# Patient Record
Sex: Female | Born: 1985 | Race: White | Hispanic: No | Marital: Single | Smoking: Current every day smoker
Health system: Southern US, Community
[De-identification: ages and names within clinical notes are randomized; demographics above are authoritative.]

---

## 2003-01-04 ENCOUNTER — Other Ambulatory Visit: Admission: RE | Admit: 2003-01-04 | Discharge: 2003-01-04 | Payer: Self-pay | Admitting: Family Medicine

## 2006-06-10 ENCOUNTER — Other Ambulatory Visit: Admission: RE | Admit: 2006-06-10 | Discharge: 2006-06-10 | Payer: Self-pay | Admitting: Family Medicine

## 2007-10-17 ENCOUNTER — Emergency Department (HOSPITAL_COMMUNITY): Admission: EM | Admit: 2007-10-17 | Discharge: 2007-10-18 | Payer: Self-pay | Admitting: Emergency Medicine

## 2007-11-22 ENCOUNTER — Other Ambulatory Visit: Admission: RE | Admit: 2007-11-22 | Discharge: 2007-11-22 | Payer: Self-pay | Admitting: Family Medicine

## 2008-11-21 ENCOUNTER — Other Ambulatory Visit: Admission: RE | Admit: 2008-11-21 | Discharge: 2008-11-21 | Payer: Self-pay | Admitting: Family Medicine

## 2009-12-18 ENCOUNTER — Other Ambulatory Visit: Admission: RE | Admit: 2009-12-18 | Discharge: 2009-12-18 | Payer: Self-pay | Admitting: Family Medicine

## 2010-01-08 IMAGING — CR DG ABDOMEN ACUTE W/ 1V CHEST
3 series · 3 of 3 positions shown · non-contrast
Comparison: None

CLINICAL DATA: Severe abdominal pain

ACUTE ABDOMEN SERIES (ABDOMEN 2 VIEW & CHEST 1 VIEW)

[w chest pa]
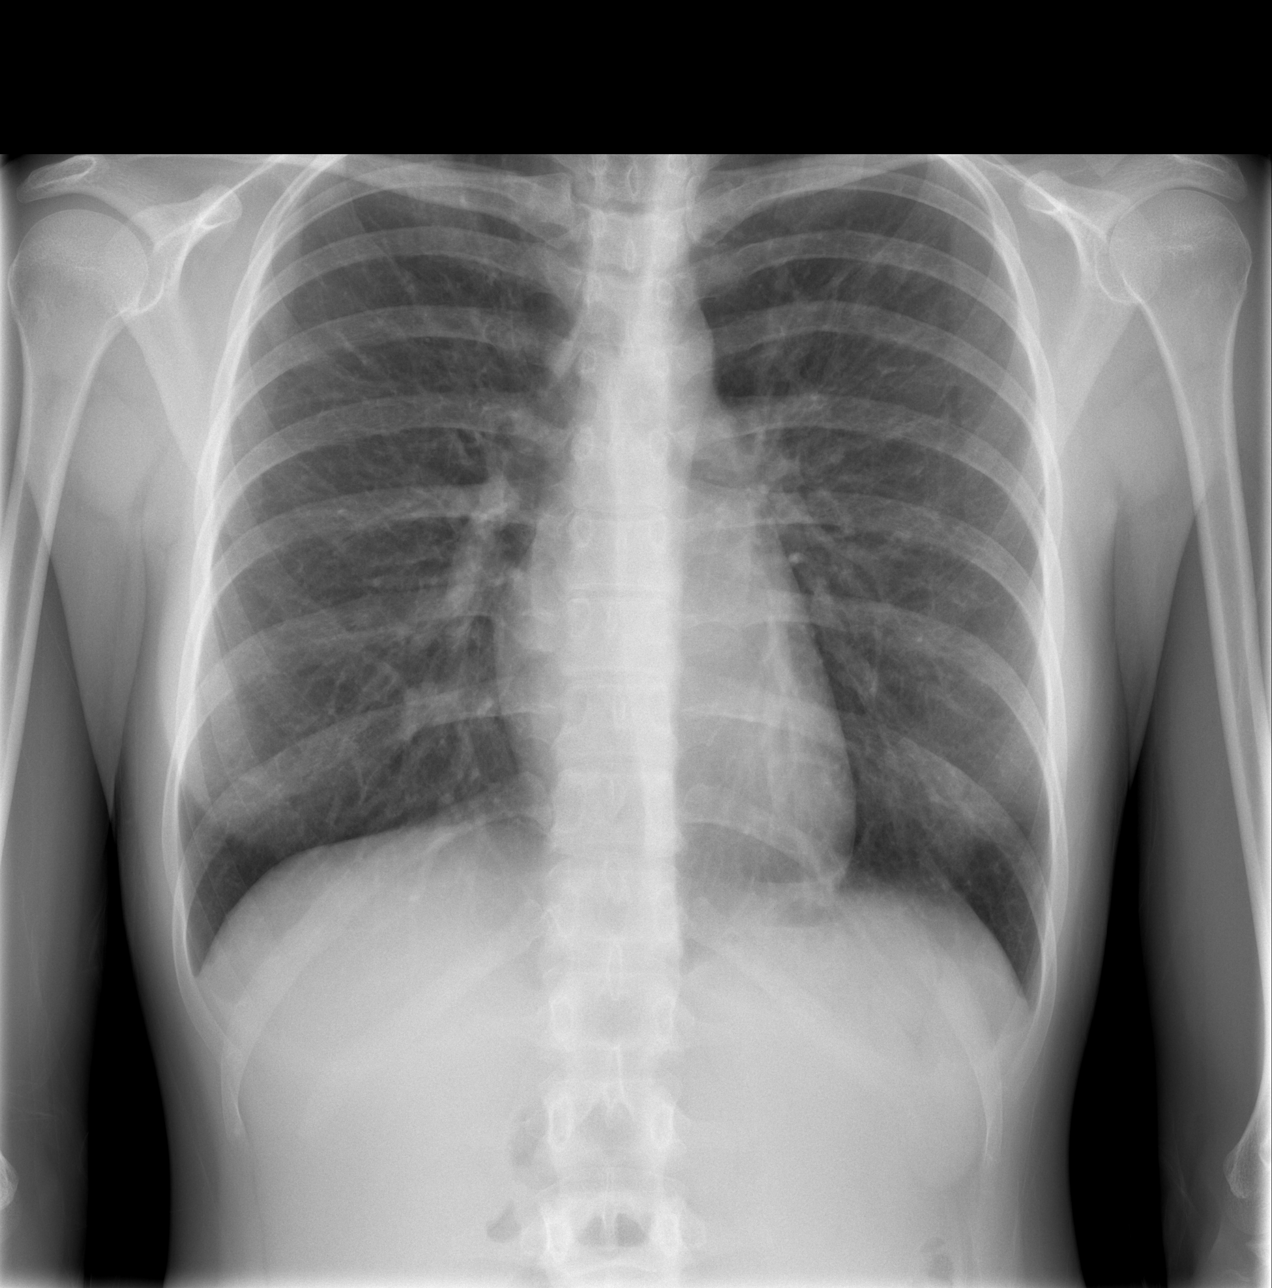

[w abdomen upright]
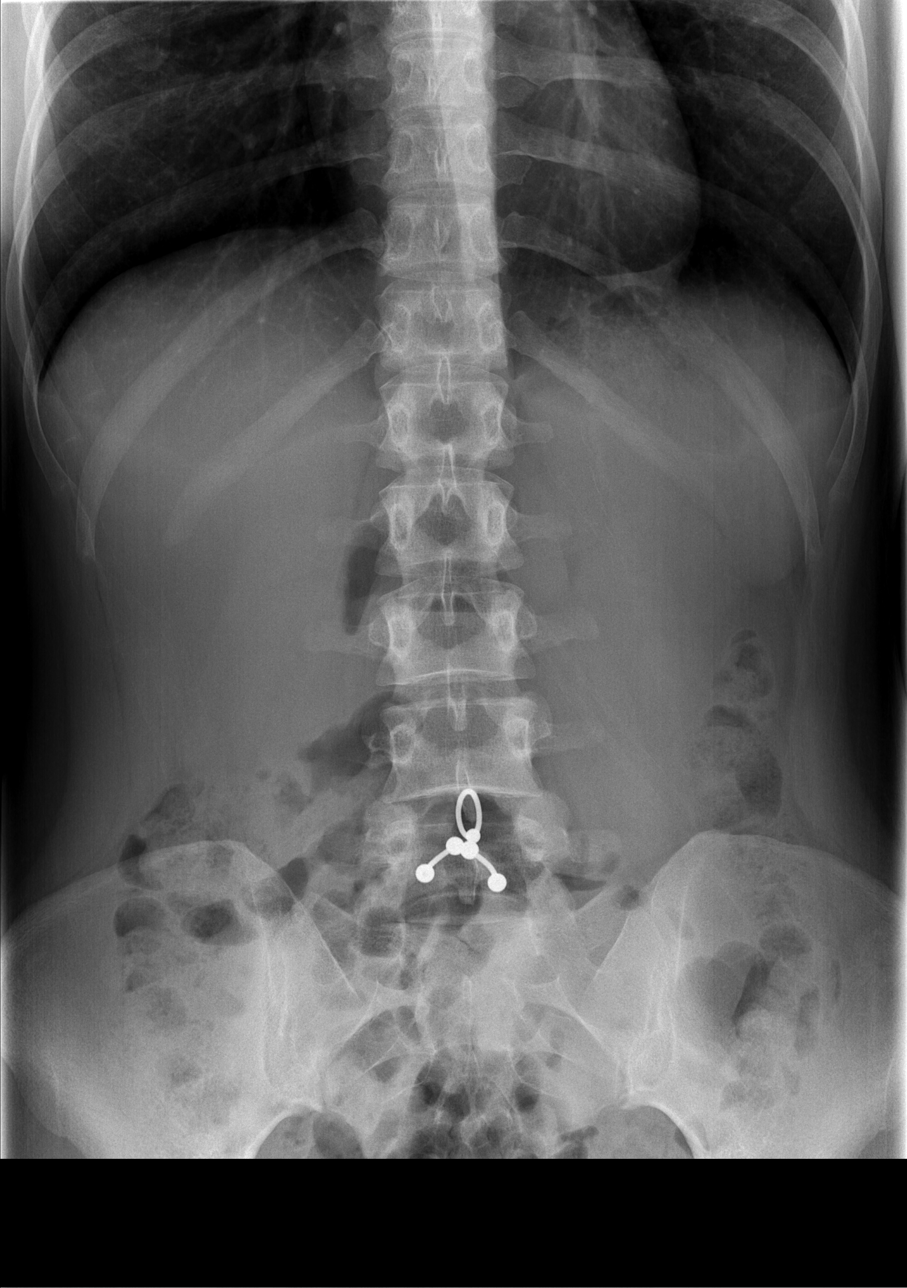

[t abdomen supine]
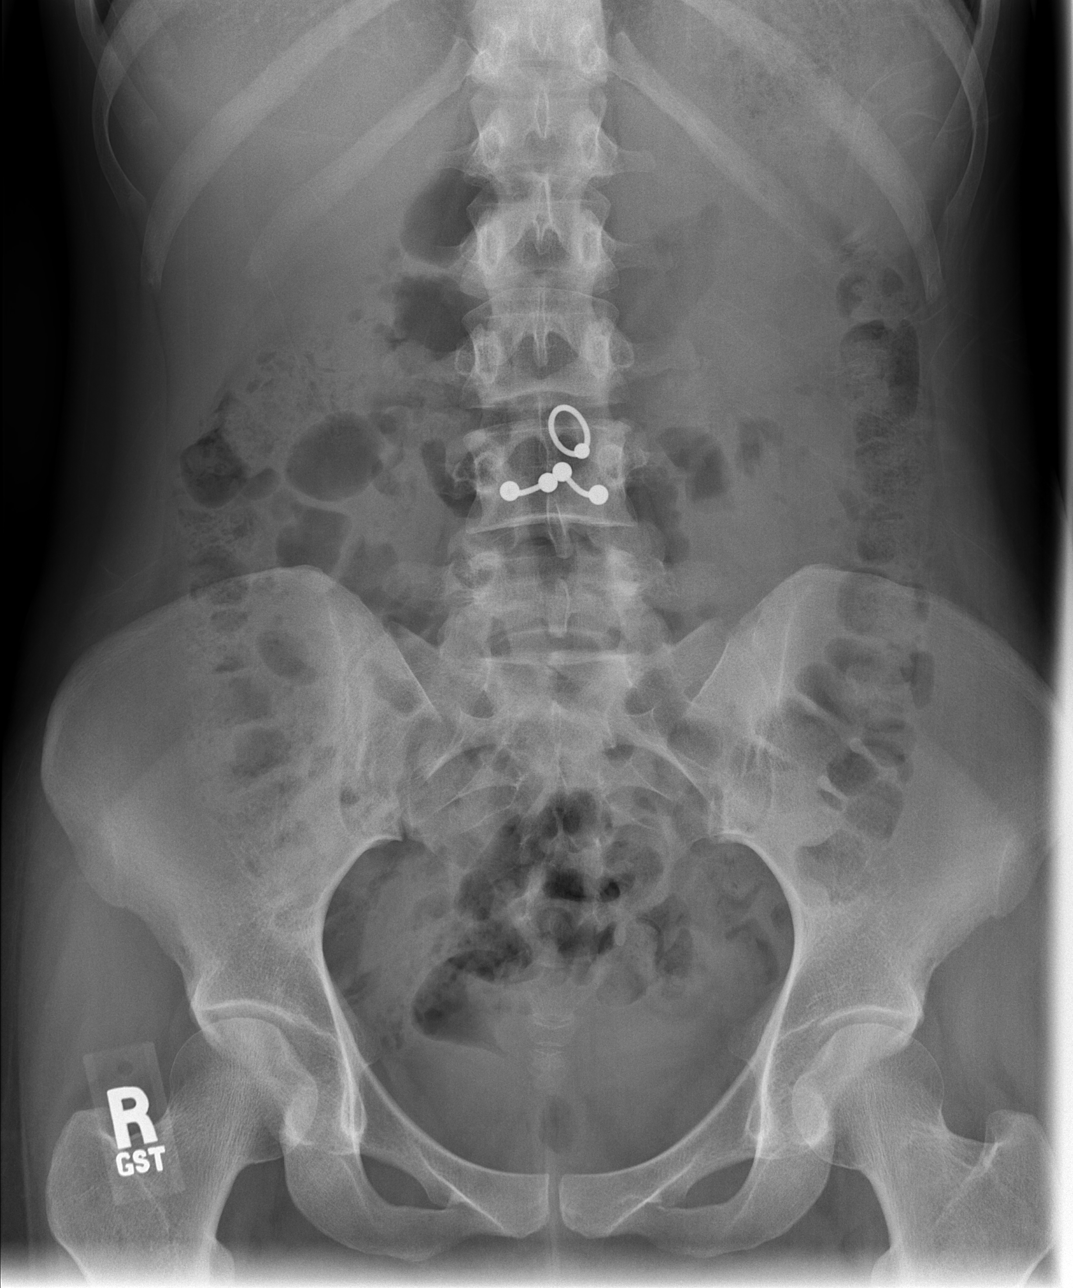

[3 of 3 positions shown; findings below may reference images not displayed]

FINDINGS: The lungs are clear.  The heart is normal.  A normal
bowel gas pattern is identified.  The osseous structures are
normal.
IMPRESSION: 1.  Negative for acute cardiopulmonary process.
2.  Normal bowel gas.

## 2010-11-18 LAB — URINALYSIS, ROUTINE W REFLEX MICROSCOPIC
Bilirubin Urine: NEGATIVE
Glucose, UA: NEGATIVE
Hgb urine dipstick: NEGATIVE
Ketones, ur: NEGATIVE
Protein, ur: NEGATIVE
pH: 8

## 2010-11-18 LAB — DIFFERENTIAL
Eosinophils Absolute: 0.2
Eosinophils Relative: 2
Lymphs Abs: 1.7
Monocytes Absolute: 0.9

## 2010-11-18 LAB — CBC
HCT: 36.1
Hemoglobin: 12.3
MCV: 92.1
Platelets: 163
WBC: 9.5

## 2010-11-18 LAB — COMPREHENSIVE METABOLIC PANEL
Albumin: 3.7
Alkaline Phosphatase: 47
BUN: 7
CO2: 25
Chloride: 103
Creatinine, Ser: 0.7
GFR calc non Af Amer: >60
Glucose, Bld: 89
Potassium: 3.9
Total Bilirubin: 0.4

## 2010-11-18 LAB — WET PREP, GENITAL

## 2010-11-18 LAB — POCT I-STAT, CHEM 8
BUN: 6
Calcium, Ion: 1.21
Chloride: 105
Creatinine, Ser: 0.8
Glucose, Bld: 86
Potassium: 3.7

## 2010-11-18 LAB — GC/CHLAMYDIA PROBE AMP, GENITAL
Chlamydia, DNA Probe: NEGATIVE
GC Probe Amp, Genital: NEGATIVE

## 2010-12-22 ENCOUNTER — Other Ambulatory Visit: Payer: Self-pay | Admitting: Physician Assistant

## 2010-12-22 ENCOUNTER — Other Ambulatory Visit (HOSPITAL_COMMUNITY)
Admission: RE | Admit: 2010-12-22 | Discharge: 2010-12-22 | Disposition: A | Discharge status: Home / Self Care | Payer: 59 | Source: Ambulatory Visit | Attending: Family Medicine | Admitting: Family Medicine

## 2010-12-22 DIAGNOSIS — Z124 Encounter for screening for malignant neoplasm of cervix: Secondary | ICD-10-CM | POA: Insufficient documentation

## 2012-01-18 ENCOUNTER — Ambulatory Visit (INDEPENDENT_AMBULATORY_CARE_PROVIDER_SITE_OTHER): Payer: 59 | Admitting: Physician Assistant

## 2012-01-18 VITALS — BP 122/60 | HR 72 | Temp 98.2°F | Resp 18 | Ht 65.0 in | Wt 143.0 lb

## 2012-01-18 DIAGNOSIS — R350 Frequency of micturition: Secondary | ICD-10-CM

## 2012-01-18 DIAGNOSIS — N390 Urinary tract infection, site not specified: Secondary | ICD-10-CM

## 2012-01-18 DIAGNOSIS — R300 Dysuria: Secondary | ICD-10-CM

## 2012-01-18 LAB — POCT URINALYSIS DIPSTICK
Bilirubin, UA: NEGATIVE
Glucose, UA: 100
Nitrite, UA: POSITIVE
Spec Grav, UA: 1.01
Urobilinogen, UA: 1

## 2012-01-18 LAB — POCT UA - MICROSCOPIC ONLY

## 2012-01-18 MED ORDER — CIPROFLOXACIN HCL 500 MG PO TABS: 500.0000 mg | ORAL_TABLET | Freq: Two times a day (BID) | ORAL | Status: DC

## 2012-01-18 NOTE — Patient Instructions (Addendum)
Urinary Tract Infection Urinary tract infections (UTIs) can develop anywhere along your urinary tract. Your urinary tract is your body's drainage system for removing wastes and extra water. Your urinary tract includes two kidneys, two ureters, a bladder, and a urethra. Your kidneys are a pair of bean-shaped organs. Each kidney is about the size of your fist. They are located below your ribs, one on each side of your spine. CAUSES Infections are caused by microbes, which are microscopic organisms, including fungi, viruses, and bacteria. These organisms are so small that they can only be seen through a microscope. Bacteria are the microbes that most commonly cause UTIs. SYMPTOMS  Symptoms of UTIs may vary by age and gender of the patient and by the location of the infection. Symptoms in young women typically include a frequent and intense urge to urinate and a painful, burning feeling in the bladder or urethra during urination. Older women and men are more likely to be tired, shaky, and weak and have muscle aches and abdominal pain. A fever may mean the infection is in your kidneys. Other symptoms of a kidney infection include pain in your back or sides below the ribs, nausea, and vomiting. DIAGNOSIS To diagnose a UTI, your caregiver will ask you about your symptoms. Your caregiver also will ask to provide a urine sample. The urine sample will be tested for bacteria and white blood cells. White blood cells are made by your body to help fight infection. TREATMENT  Typically, UTIs can be treated with medication. Because most UTIs are caused by a bacterial infection, they usually can be treated with the use of antibiotics. The choice of antibiotic and length of treatment depend on your symptoms and the type of bacteria causing your infection. HOME CARE INSTRUCTIONS  If you were prescribed antibiotics, take them exactly as your caregiver instructs you. Finish the medication even if you feel better after you  have only taken some of the medication.  Drink enough water and fluids to keep your urine clear or pale yellow.  Avoid caffeine, tea, and carbonated beverages. They tend to irritate your bladder.  Empty your bladder often. Avoid holding urine for long periods of time.  Empty your bladder before and after sexual intercourse.  After a bowel movement, women should cleanse from front to back. Use each tissue only once. SEEK MEDICAL CARE IF:   You have back pain.  You develop a fever.  Your symptoms do not begin to resolve within 3 days. SEEK IMMEDIATE MEDICAL CARE IF:   You have severe back pain or lower abdominal pain.  You develop chills.  You have nausea or vomiting.  You have continued burning or discomfort with urination. MAKE SURE YOU:   Understand these instructions.  Will watch your condition.  Will get help right away if you are not doing well or get worse. Document Released: 11/11/2004 Document Revised: 08/03/2011 Document Reviewed: 03/12/2011 [employer] Patient Information 2013 [employer].  

## 2012-01-18 NOTE — Progress Notes (Signed)
Subjective:    Patient ID: Cassandra Clay, female    DOB: [birth year], 25 y.o.   MRN: [id number]  HPI   Cassandra Clay is a 26 yr old female here with UTI symptoms that began last night.  Experiencing frequency and dysuria.  No abd or back pain.  No NV.  No fever or chills.  No vaginal symptoms.  Gets UTIs once every couple years.  Has been hydrating well and using Azo with good symptom relief.     Review of Systems  Constitutional: Negative for fever and chills.  HENT: Negative.   Respiratory: Negative.   Cardiovascular: Negative.   Gastrointestinal: Negative.   Genitourinary: Positive for dysuria and frequency. Negative for hematuria, flank pain and vaginal discharge.  Musculoskeletal: Negative.   Neurological: Negative.        Objective:   Physical Exam  Vitals reviewed. Constitutional: She is oriented to person, place, and time. She appears well-developed and well-nourished.  HENT:  Head: Normocephalic and atraumatic.  Cardiovascular: Normal rate, regular rhythm, normal heart sounds and intact distal pulses.  Exam reveals no gallop and no friction rub.   No murmur heard. Pulmonary/Chest: Effort normal and breath sounds normal. She has no wheezes. She has no rales.  Abdominal: Soft. Bowel sounds are normal. She exhibits no distension and no mass. There is tenderness in the suprapubic area. There is CVA tenderness (right). There is no rigidity, no rebound, no guarding and no tenderness at McBurney's point.  Neurological: She is alert and oriented to person, place, and time.  Skin: Skin is warm and dry.  Psychiatric: She has a normal mood and affect. Her behavior is normal.     Filed Vitals:   01/18/12 1351  BP: 122/60  Pulse: 72  Temp: 98.2 F (36.8 C)  Resp: 18      Results for orders placed in visit on 01/18/12  POCT URINALYSIS DIPSTICK      Component Value Range   Color, UA orange     Clarity, UA clear     Glucose, UA 100     Bilirubin, UA neg     Ketones, UA neg      Spec Grav, UA 1.010     Blood, UA moderate     pH, UA 6.5     Protein, UA 30     Urobilinogen, UA 1.0     Nitrite, UA pos     Leukocytes, UA Negative    POCT UA - MICROSCOPIC ONLY      Component Value Range   WBC, Ur, HPF, POC 1-5     RBC, urine, microscopic 3-8     Bacteria, U Microscopic trace     Mucus, UA trace     Epithelial cells, urine per micros 0-4     Crystals, Ur, HPF, POC neg     Casts, Ur, LPF, POC neg     Yeast, UA neg           Assessment & Plan:   1. Dysuria  POCT urinalysis dipstick, POCT UA - Microscopic Only, Urine culture  2. Urine frequency  POCT urinalysis dipstick, POCT UA - Microscopic Only  3. UTI (lower urinary tract infection)  Urine culture, ciprofloxacin (CIPRO) 500 MG tablet    Cassandra Clay is a very pleasant 26 yr old female with UTI.  Will treat with 5 days of Cipro.  Encouraged pt to continue hydrating well.  She may continue using Azo for symptomatic relief.  She will call or  RTC if worsening or not improving.

## 2012-01-21 LAB — URINE CULTURE: Colony Count: 50000

## 2012-05-09 ENCOUNTER — Other Ambulatory Visit: Payer: Self-pay | Admitting: Obstetrics and Gynecology

## 2012-05-09 ENCOUNTER — Other Ambulatory Visit (HOSPITAL_COMMUNITY)
Admission: RE | Admit: 2012-05-09 | Discharge: 2012-05-09 | Disposition: A | Discharge status: Home / Self Care | Payer: 59 | Source: Ambulatory Visit | Attending: Obstetrics and Gynecology | Admitting: Obstetrics and Gynecology

## 2012-05-09 DIAGNOSIS — Z01419 Encounter for gynecological examination (general) (routine) without abnormal findings: Secondary | ICD-10-CM | POA: Insufficient documentation

## 2012-05-09 DIAGNOSIS — Z113 Encounter for screening for infections with a predominantly sexual mode of transmission: Secondary | ICD-10-CM | POA: Insufficient documentation

## 2012-08-21 DIAGNOSIS — M222X9 Patellofemoral disorders, unspecified knee: Secondary | ICD-10-CM | POA: Insufficient documentation

## 2012-11-08 ENCOUNTER — Telehealth: Payer: Self-pay | Admitting: *Deleted

## 2012-11-08 NOTE — Telephone Encounter (Signed)
Incorrect patient chart opened.Cassandra Clay [place]

## 2014-05-16 ENCOUNTER — Other Ambulatory Visit (HOSPITAL_COMMUNITY)
Admission: RE | Admit: 2014-05-16 | Discharge: 2014-05-16 | Disposition: A | Discharge status: Home / Self Care | Payer: 59 | Source: Ambulatory Visit | Attending: Obstetrics and Gynecology | Admitting: Obstetrics and Gynecology

## 2014-05-16 ENCOUNTER — Other Ambulatory Visit: Payer: Self-pay | Admitting: Obstetrics and Gynecology

## 2014-05-16 DIAGNOSIS — Z01419 Encounter for gynecological examination (general) (routine) without abnormal findings: Secondary | ICD-10-CM | POA: Diagnosis not present

## 2014-05-16 DIAGNOSIS — Z1151 Encounter for screening for human papillomavirus (HPV): Secondary | ICD-10-CM | POA: Diagnosis present

## 2014-05-21 LAB — CYTOLOGY - PAP

## 2014-12-17 DIAGNOSIS — M84374A Stress fracture, right foot, initial encounter for fracture: Secondary | ICD-10-CM | POA: Insufficient documentation

## 2014-12-17 DIAGNOSIS — M79671 Pain in right foot: Secondary | ICD-10-CM | POA: Insufficient documentation

## 2016-06-07 ENCOUNTER — Other Ambulatory Visit: Payer: Self-pay | Admitting: Obstetrics and Gynecology

## 2016-06-07 ENCOUNTER — Other Ambulatory Visit (HOSPITAL_COMMUNITY)
Admission: RE | Admit: 2016-06-07 | Discharge: 2016-06-07 | Disposition: A | Discharge status: Home / Self Care | Payer: Commercial Managed Care - HMO | Source: Ambulatory Visit | Attending: Obstetrics and Gynecology | Admitting: Obstetrics and Gynecology

## 2016-06-07 DIAGNOSIS — Z01419 Encounter for gynecological examination (general) (routine) without abnormal findings: Secondary | ICD-10-CM | POA: Diagnosis not present

## 2016-06-09 LAB — CYTOLOGY - PAP: DIAGNOSIS: NEGATIVE

## 2016-12-09 DIAGNOSIS — Z719 Counseling, unspecified: Secondary | ICD-10-CM | POA: Diagnosis not present

## 2017-05-11 DIAGNOSIS — J329 Chronic sinusitis, unspecified: Secondary | ICD-10-CM | POA: Diagnosis not present

## 2017-07-18 DIAGNOSIS — Z13220 Encounter for screening for lipoid disorders: Secondary | ICD-10-CM | POA: Diagnosis not present

## 2017-07-18 DIAGNOSIS — Z01419 Encounter for gynecological examination (general) (routine) without abnormal findings: Secondary | ICD-10-CM | POA: Diagnosis not present

## 2017-07-25 DIAGNOSIS — Z30431 Encounter for routine checking of intrauterine contraceptive device: Secondary | ICD-10-CM | POA: Diagnosis not present

## 2021-02-24 NOTE — H&P (Signed)
Cassandra Clay is an 36 y.o. G0 who is admitted for Laparoscopic bilateral salpingectomy due to desire for permanent sterilization.  Although patient has Mirena IUD currently (second one placed in 2019), she desires definitive/permanent sterilization, especially in this political climate. She would like to keep her IUD in place as it causes amenorrhea. She is 100% positive that she never has nor will ever desire children in the future.  Patient Active Problem List   Diagnosis Date Noted   Right foot pain 12/17/2014   Stress fracture of right foot 12/17/2014   Patellofemoral pain syndrome 08/21/2012   MEDICAL/FAMILY/SOCIAL HX: No LMP recorded.    No past medical history on file.  No past surgical history on file.  Family History  Problem Relation Age of Onset   Hypertension Mother    Hypertension Maternal Grandfather     Social History:  reports that she has been smoking. She does not have any smokeless tobacco history on file. She reports current alcohol use. She reports that she does not use drugs.  ALLERGIES/MEDS:  Allergies:  Allergies  Allergen Reactions   Sulfa Antibiotics     Unsure -child hood    No medications prior to admission.     Review of Systems  Constitutional: Negative.   HENT: Negative.    Eyes: Negative.   Respiratory: Negative.    Cardiovascular: Negative.   Gastrointestinal: Negative.   Genitourinary: Negative.   Musculoskeletal: Negative.   Skin: Negative.   Neurological: Negative.   Endo/Heme/Allergies: Negative.   Psychiatric/Behavioral: Negative.     There were no vitals taken for this visit. Gen:  NAD, pleasant and cooperative Cardio:  RRR Pulm:  CTAB, no wheezes/rales/rhonchi Abd:  Soft, non-distended, non-tender throughout, no rebound/guarding Ext:  No bilateral LE edema, no bilateral calf tenderness  No results found for this or any previous visit (from the past 24 hour(s)).  No results found.   ASSESSMENT/PLAN: Cassandra Clay is a 36 y.o. G0 who is admitted for Laparoscopic bilateral salpingectomy due to desire for permanent sterilization.  - Admit to [facility] - Admit labs (CBC, T&S, COVID screen) - Diet:  NPO - IVF:  Per anesthesia - VTE Prophylaxis:  SCDs - No antibiotics indicated - D/C home same-day  Consents: I have discussed with the patient that this surgery is performed to provide permanent female sterilization by removing the entirety of each fallopian tube.  Prior to surgery, the risks and benefits of the surgery, as well as alternative treatments, have been discussed.  The risks of proceeding with this surgery include, but are not limited to, bleeding, including the need for a blood transfusion, infection, damage to surrounding organs and tissues, damage to bladder, damage to ureters, causing kidney damage, and requiring additional procedures, damage to bowels, resulting in further surgery, postoperative pain, short-term and long-term, scarring on the abdominal wall and intra-abdominally, need for further surgery, development of an incisional hernia, need for an open procedure, deep vein thrombosis and/or pulmonary embolism, wound infection and/or separation, painful intercourse, failure of the procedure to prevent pregnancy, increased risk of ectopic pregnancy requiring surgery if procedure failure occurs complications the course of which cannot be predicted or prevented, and death. Patient was counseled on the risks, benefits and alternatives of bilateral tubal sterilization.  Like any surgery this procedure carries risks including to but not limited to infection, damage to surrounding structures requiring additional surgery, thromboembolism, and even death.  She understands that there is a failure rate of approximently 1-2%, and if  she were to get pregnant after a bilateral tubal sterilization, there is a 50% chance of ectopic pregnancy.  She was advised that if she should miss her period and/or have a  positive pregnancy test after her bilateral tubal sterilization, she should seek medical assistance.  She is aware that this is a permanent procedure which is not indented to be reversed. Patient was consented for blood products.  The patient is aware that bleeding may result in the need for a blood transfusion which includes risk of transmission of HIV (1:2 million), Hepatitis C (1:2 million), and Hepatitis B (1:200 thousand) and transfusion reaction.  Patient voiced understanding of the above risks as well as understanding of indications for blood transfusion.    Steva Ready, DO [phone] (office)

## 2021-02-27 ENCOUNTER — Other Ambulatory Visit: Payer: Self-pay

## 2021-02-27 ENCOUNTER — Encounter (HOSPITAL_BASED_OUTPATIENT_CLINIC_OR_DEPARTMENT_OTHER): Payer: Self-pay | Admitting: Obstetrics and Gynecology

## 2021-03-09 ENCOUNTER — Encounter (HOSPITAL_BASED_OUTPATIENT_CLINIC_OR_DEPARTMENT_OTHER)
Admission: RE | Admit: 2021-03-09 | Discharge: 2021-03-09 | Disposition: A | Discharge status: Home / Self Care | Payer: 59 | Source: Ambulatory Visit | Attending: Obstetrics and Gynecology | Admitting: Obstetrics and Gynecology

## 2021-03-09 DIAGNOSIS — F17200 Nicotine dependence, unspecified, uncomplicated: Secondary | ICD-10-CM | POA: Diagnosis not present

## 2021-03-09 DIAGNOSIS — Z975 Presence of (intrauterine) contraceptive device: Secondary | ICD-10-CM | POA: Diagnosis not present

## 2021-03-09 DIAGNOSIS — Z302 Encounter for sterilization: Secondary | ICD-10-CM | POA: Diagnosis present

## 2021-03-09 LAB — CBC
HCT: 40.6 % (ref 36.0–46.0)
Hemoglobin: 13.6 g/dL (ref 12.0–15.0)
MCH: 30.6 pg (ref 26.0–34.0)
MCHC: 33.5 g/dL (ref 30.0–36.0)
MCV: 91.4 fL (ref 80.0–100.0)
Platelets: 249 10*3/uL (ref 150–400)
RBC: 4.44 MIL/uL (ref 3.87–5.11)
RDW: 12.2 % (ref 11.5–15.5)
WBC: 9.5 10*3/uL (ref 4.0–10.5)
nRBC: 0 % (ref 0.0–0.2)

## 2021-03-09 LAB — TYPE AND SCREEN
ABO/RH(D): A POS
Antibody Screen: NEGATIVE

## 2021-03-11 ENCOUNTER — Other Ambulatory Visit: Payer: Self-pay

## 2021-03-11 ENCOUNTER — Ambulatory Visit (HOSPITAL_BASED_OUTPATIENT_CLINIC_OR_DEPARTMENT_OTHER): Payer: 59 | Admitting: Anesthesiology

## 2021-03-11 ENCOUNTER — Encounter (HOSPITAL_BASED_OUTPATIENT_CLINIC_OR_DEPARTMENT_OTHER): Payer: Self-pay | Admitting: Obstetrics and Gynecology

## 2021-03-11 ENCOUNTER — Ambulatory Visit (HOSPITAL_BASED_OUTPATIENT_CLINIC_OR_DEPARTMENT_OTHER)
Admission: RE | Admit: 2021-03-11 | Discharge: 2021-03-11 | Disposition: A | Discharge status: Home / Self Care | Payer: 59 | Source: Ambulatory Visit | Attending: Obstetrics and Gynecology | Admitting: Obstetrics and Gynecology

## 2021-03-11 ENCOUNTER — Encounter (HOSPITAL_BASED_OUTPATIENT_CLINIC_OR_DEPARTMENT_OTHER)
Admission: RE | Disposition: A | Discharge status: Home / Self Care | Payer: Self-pay | Source: Ambulatory Visit | Attending: Obstetrics and Gynecology

## 2021-03-11 DIAGNOSIS — F17200 Nicotine dependence, unspecified, uncomplicated: Secondary | ICD-10-CM | POA: Insufficient documentation

## 2021-03-11 DIAGNOSIS — Z302 Encounter for sterilization: Secondary | ICD-10-CM | POA: Diagnosis not present

## 2021-03-11 DIAGNOSIS — Z975 Presence of (intrauterine) contraceptive device: Secondary | ICD-10-CM | POA: Insufficient documentation

## 2021-03-11 HISTORY — PX: LAPAROSCOPIC BILATERAL SALPINGECTOMY: SHX5889

## 2021-03-11 LAB — POCT PREGNANCY, URINE: Preg Test, Ur: NEGATIVE

## 2021-03-11 SURGERY — SALPINGECTOMY, BILATERAL, LAPAROSCOPIC
Anesthesia: General | Laterality: Bilateral

## 2021-03-11 MED ORDER — FENTANYL CITRATE (PF) 100 MCG/2ML IJ SOLN
INTRAMUSCULAR | Status: DC | PRN
  Administered 2021-03-11 (×2): 50 ug via INTRAVENOUS
  Administered 2021-03-11: 100 ug via INTRAVENOUS

## 2021-03-11 MED ORDER — AMISULPRIDE (ANTIEMETIC) 5 MG/2ML IV SOLN: 10.0000 mg | Freq: Once | INTRAVENOUS | Status: DC | PRN

## 2021-03-11 MED ORDER — PROPOFOL 10 MG/ML IV BOLUS
INTRAVENOUS | Status: DC | PRN
  Administered 2021-03-11: 50 mg via INTRAVENOUS
  Administered 2021-03-11: 150 mg via INTRAVENOUS

## 2021-03-11 MED ORDER — SUGAMMADEX SODIUM 200 MG/2ML IV SOLN
INTRAVENOUS | Status: DC | PRN
  Administered 2021-03-11: 200 mg via INTRAVENOUS

## 2021-03-11 MED ORDER — HYDROMORPHONE HCL 1 MG/ML IJ SOLN: 0.2500 mg | INTRAMUSCULAR | Status: DC | PRN

## 2021-03-11 MED ORDER — MEPERIDINE HCL 25 MG/ML IJ SOLN: 6.2500 mg | INTRAMUSCULAR | Status: DC | PRN

## 2021-03-11 MED ORDER — FENTANYL CITRATE (PF) 100 MCG/2ML IJ SOLN
INTRAMUSCULAR | Status: AC
  Filled 2021-03-11: qty 2

## 2021-03-11 MED ORDER — EPHEDRINE SULFATE (PRESSORS) 50 MG/ML IJ SOLN: INTRAMUSCULAR | Status: DC | PRN

## 2021-03-11 MED ORDER — LIDOCAINE HCL (CARDIAC) PF 100 MG/5ML IV SOSY
PREFILLED_SYRINGE | INTRAVENOUS | Status: DC | PRN
  Administered 2021-03-11: 30 mg via INTRAVENOUS

## 2021-03-11 MED ORDER — IBUPROFEN 800 MG PO TABS: 800.0000 mg | ORAL_TABLET | Freq: Three times a day (TID) | ORAL | 0 refills | Status: AC | PRN

## 2021-03-11 MED ORDER — DEXAMETHASONE SODIUM PHOSPHATE 4 MG/ML IJ SOLN
INTRAMUSCULAR | Status: DC | PRN
  Administered 2021-03-11: 5 mg via INTRAVENOUS

## 2021-03-11 MED ORDER — MIDAZOLAM HCL 2 MG/2ML IJ SOLN
INTRAMUSCULAR | Status: AC
  Filled 2021-03-11: qty 2

## 2021-03-11 MED ORDER — OXYCODONE HCL 5 MG/5ML PO SOLN: 5.0000 mg | Freq: Once | ORAL | Status: DC | PRN

## 2021-03-11 MED ORDER — OXYCODONE HCL 5 MG PO TABS: 5.0000 mg | ORAL_TABLET | Freq: Once | ORAL | Status: DC | PRN

## 2021-03-11 MED ORDER — LACTATED RINGERS IV SOLN: INTRAVENOUS | Status: DC

## 2021-03-11 MED ORDER — OXYCODONE HCL 5 MG PO TABS: 5.0000 mg | ORAL_TABLET | ORAL | 0 refills | Status: AC | PRN

## 2021-03-11 MED ORDER — PROMETHAZINE HCL 25 MG/ML IJ SOLN: 6.2500 mg | INTRAMUSCULAR | Status: DC | PRN

## 2021-03-11 MED ORDER — MIDAZOLAM HCL 5 MG/5ML IJ SOLN
INTRAMUSCULAR | Status: DC | PRN
  Administered 2021-03-11: 2 mg via INTRAVENOUS

## 2021-03-11 MED ORDER — ROCURONIUM BROMIDE 100 MG/10ML IV SOLN
INTRAVENOUS | Status: DC | PRN
  Administered 2021-03-11: 80 mg via INTRAVENOUS

## 2021-03-11 MED ORDER — ONDANSETRON HCL 4 MG/2ML IJ SOLN
INTRAMUSCULAR | Status: DC | PRN
Start: 2021-03-11 — End: 2021-03-11
  Administered 2021-03-11: 4 mg via INTRAVENOUS

## 2021-03-11 MED ORDER — BUPIVACAINE HCL (PF) 0.25 % IJ SOLN
INTRAMUSCULAR | Status: DC | PRN
  Administered 2021-03-11: 30 mL

## 2021-03-11 SURGICAL SUPPLY — 23 items
ADH SKN CLS APL DERMABOND .7 (GAUZE/BANDAGES/DRESSINGS) ×1
DERMABOND ADVANCED (GAUZE/BANDAGES/DRESSINGS) ×1
DERMABOND ADVANCED .7 DNX12 (GAUZE/BANDAGES/DRESSINGS) ×2 IMPLANT
DURAPREP 26ML APPLICATOR (WOUND CARE) ×3 IMPLANT
GAUZE 4X4 16PLY ~~LOC~~+RFID DBL (SPONGE) ×3 IMPLANT
GLOVE SURG ENC MOIS LTX SZ6.5 (GLOVE) ×6 IMPLANT
GLOVE SURG UNDER POLY LF SZ6.5 (GLOVE) ×3 IMPLANT
GLOVE SURG UNDER POLY LF SZ7 (GLOVE) ×6 IMPLANT
GOWN STRL REUS W/ TWL LRG LVL3 (GOWN DISPOSABLE) ×4 IMPLANT
GOWN STRL REUS W/TWL LRG LVL3 (GOWN DISPOSABLE) ×4
LIGASURE VESSEL 5MM BLUNT TIP (ELECTROSURGICAL) ×3 IMPLANT
NS IRRIG 1000ML POUR BTL (IV SOLUTION) ×3 IMPLANT
PACK LAPAROSCOPY BASIN (CUSTOM PROCEDURE TRAY) ×3 IMPLANT
PACK TRENDGUARD 450 HYBRID PRO (MISCELLANEOUS) IMPLANT
PAD OB MATERNITY 4.3X12.25 (PERSONAL CARE ITEMS) ×3 IMPLANT
SET TUBE SMOKE EVAC HIGH FLOW (TUBING) ×3 IMPLANT
SLEEVE ENDOPATH XCEL 5M (ENDOMECHANICALS) ×6 IMPLANT
SUT VICRYL 4-0 PS2 18IN ABS (SUTURE) ×4 IMPLANT
TOWEL GREEN STERILE FF (TOWEL DISPOSABLE) ×6 IMPLANT
TRAY FOLEY W/BAG SLVR 14FR LF (SET/KITS/TRAYS/PACK) ×3 IMPLANT
TRENDGUARD 450 HYBRID PRO PACK (MISCELLANEOUS) ×2
TROCAR XCEL NON-BLD 5MMX100MML (ENDOMECHANICALS) ×3 IMPLANT
WARMER LAPAROSCOPE (MISCELLANEOUS) ×3 IMPLANT

## 2021-03-11 NOTE — Interval H&P Note (Signed)
History and Physical Interval Note:  03/11/2021 12:39 PM  Cassandra Clay  has presented today for surgery, with the diagnosis of Sterilization.  The various methods of treatment have been discussed with the patient and family. After consideration of risks, benefits and other options for treatment, the patient has consented to  Procedure(s): LAPAROSCOPIC BILATERAL SALPINGECTOMY (Bilateral) as a surgical intervention.  The patient's history has been reviewed, patient examined, no change in status, stable for surgery.  I have reviewed the patient's chart and labs.  Questions were answered to the patient's satisfaction.     Drema Dallas

## 2021-03-11 NOTE — Anesthesia Postprocedure Evaluation (Signed)
Anesthesia Post Note  Patient: Cassandra Clay  Procedure(s) Performed: LAPAROSCOPIC BILATERAL SALPINGECTOMY (Bilateral)     Patient location during evaluation: PACU Anesthesia Type: General Level of consciousness: awake and alert Pain management: pain level controlled Vital Signs Assessment: post-procedure vital signs reviewed and stable Respiratory status: spontaneous breathing, nonlabored ventilation and respiratory function stable Cardiovascular status: blood pressure returned to baseline and stable Postop Assessment: no apparent nausea or vomiting Anesthetic complications: no   No notable events documented.  Last Vitals:  Vitals:   03/11/21 1430 03/11/21 1500  BP: (!) 108/57 (!) 130/59  Pulse: 60 61  Resp: 15 18  Temp:  37.1 C  SpO2: 99% 98%    Last Pain:  Vitals:   03/11/21 1500  TempSrc:   PainSc: 2                  Lowella Curb

## 2021-03-11 NOTE — Transfer of Care (Signed)
Immediate Anesthesia Transfer of Care Note  Patient: Cassandra Clay  Procedure(s) Performed: LAPAROSCOPIC BILATERAL SALPINGECTOMY (Bilateral)  Patient Location: PACU  Anesthesia Type:General  Level of Consciousness: awake, alert  and oriented  Airway & Oxygen Therapy: Patient Spontanous Breathing and Patient connected to face mask oxygen  Post-op Assessment: Report given to RN and Post -op Vital signs reviewed and stable  Post vital signs: Reviewed and stable  Last Vitals:  Vitals Value Taken Time  BP    Temp    Pulse    Resp    SpO2      Last Pain:  Vitals:   03/11/21 1122  TempSrc: Oral  PainSc: 3          Complications: No notable events documented.

## 2021-03-11 NOTE — Op Note (Signed)
Pre Op Dx:   1. Desires permanent sterilization  Post Op Dx:   Same as pre-operative diagnoses  Procedure:   Laparoscopic bilateral salpingectomy   Surgeon:  Dr. Drema Dallas Assistants:  Dr. Christophe Louis (assistant needed due to the complexity of the anatomy) Anesthesia:  General   EBL:  5cc  IVF:  600cc UOP:  See anesthesia documentation   Drains:  Foley catheter Specimen removed:  Bilateral fallopian tubes - sent to pathology Device(s) implanted: None Case Type:  Clean-contaminated Findings:  Normal-appearing cervix, IUD strings visualized. Normal-appearing uterus, bilateral fallopian tubes, and ovaries. Complications: None Indications:  36 y.o. G0 who desires permanent sterilization.  Description of each procedure:  After informed consent the patient was taken to the operating room where general endotracheal anesthesia was administered and found to be adequate. She was placed in dorsal lithotomy position. She was prepped and draped in the usual sterile fashion. An operative timeout was performed. A Hulka tenaculum was placed in the cervix and her bladder was drained. The abdomen was entered through an intraumbilical incision using a 28mm  trocar under direct visualization and a pneumoperitoneum was established. Bilateral lower quadrant 16mm ports were placed under direct visualization and atraumatic entry confirmed. Local anesthetic used at each port site. The left fallopian tube was divided from the mesosalpinx using the Ligasure device. Adequate hemostasis noted. This process was completed on the contralateral side. Electrosurgery used to provide additional hemostasis on the mesosalpinx on the left. Hemostasis confirmed. The bilateral quadrant ports were withdrawn under visualization and the pneumoperitoneum was reduced completely, assisted with the patient having two deep breaths. The camera port was then withdrawn under visualization. The skin was closed with 4-0 Vicryl in subcuticular  fashion. The tenaculum was removed and cervical hemostasis confirmed. The Hulka Tenaculum was removed and silver nitrate applied to the tenaculum site. IUD strings visualized. Foley catheter removed. The patient was returned to dorsal supine position, awakened and extubated in the OR having appeared to tolerate the procedure well. All sponge, needle, and instrument counts were correct x 2 at the end of the case.  Disposition:  PACU  Drema Dallas, DO

## 2021-03-11 NOTE — Discharge Instructions (Signed)

## 2021-03-11 NOTE — Anesthesia Preprocedure Evaluation (Signed)
Anesthesia Evaluation  Patient identified by MRN, date of birth, ID band Patient awake    Reviewed: Allergy & Precautions, NPO status , Patient's Chart, lab work & pertinent test results  Airway Mallampati: II  TM Distance: >3 FB Neck ROM: Full    Dental no notable dental hx.    Pulmonary neg pulmonary ROS, Current Smoker and Patient abstained from smoking.,    Pulmonary exam normal breath sounds clear to auscultation       Cardiovascular negative cardio ROS Normal cardiovascular exam Rhythm:Regular Rate:Normal     Neuro/Psych negative neurological ROS  negative psych ROS   GI/Hepatic negative GI ROS, Neg liver ROS,   Endo/Other  negative endocrine ROS  Renal/GU negative Renal ROS  negative genitourinary   Musculoskeletal negative musculoskeletal ROS (+)   Abdominal   Peds negative pediatric ROS (+)  Hematology negative hematology ROS (+)   Anesthesia Other Findings   Reproductive/Obstetrics negative OB ROS                             Anesthesia Physical Anesthesia Plan  ASA: 2  Anesthesia Plan: General   Post-op Pain Management:    Induction: Intravenous  PONV Risk Score and Plan: 2 and Ondansetron, Midazolam and Treatment may vary due to age or medical condition  Airway Management Planned: Oral ETT  Additional Equipment:   Intra-op Plan:   Post-operative Plan: Extubation in OR  Informed Consent: I have reviewed the patients History and Physical, chart, labs and discussed the procedure including the risks, benefits and alternatives for the proposed anesthesia with the patient or authorized representative who has indicated his/her understanding and acceptance.     Dental advisory given  Plan Discussed with: CRNA  Anesthesia Plan Comments:         Anesthesia Quick Evaluation

## 2021-03-11 NOTE — Anesthesia Procedure Notes (Signed)
Procedure Name: Intubation Date/Time: 03/11/2021 1:12 PM Performed by: Willa Frater, CRNA Pre-anesthesia Checklist: Patient identified, Emergency Drugs available, Suction available and Patient being monitored Patient Re-evaluated:Patient Re-evaluated prior to induction Oxygen Delivery Method: Circle system utilized Preoxygenation: Pre-oxygenation with 100% oxygen Induction Type: IV induction Ventilation: Mask ventilation without difficulty Laryngoscope Size: Mac and 3 Grade View: Grade I Tube type: Oral Tube size: 7.0 mm Number of attempts: 1 Airway Equipment and Method: Stylet and Oral airway Placement Confirmation: ETT inserted through vocal cords under direct vision, positive ETCO2 and breath sounds checked- equal and bilateral Secured at: 22 cm Tube secured with: Tape Dental Injury: Teeth and Oropharynx as per pre-operative assessment

## 2021-03-12 ENCOUNTER — Encounter (HOSPITAL_BASED_OUTPATIENT_CLINIC_OR_DEPARTMENT_OTHER): Payer: Self-pay | Admitting: Obstetrics and Gynecology

## 2021-03-12 LAB — SURGICAL PATHOLOGY

## 2021-03-12 NOTE — Addendum Note (Signed)
Addendum  created 03/12/21 1125 by Ronnette Hila, CRNA   Charge Capture section accepted

## 2021-11-13 ENCOUNTER — Other Ambulatory Visit: Payer: Self-pay | Admitting: Orthopedic Surgery

## 2021-11-13 DIAGNOSIS — M25562 Pain in left knee: Secondary | ICD-10-CM

## 2021-12-02 ENCOUNTER — Ambulatory Visit
Admission: RE | Admit: 2021-12-02 | Discharge: 2021-12-02 | Disposition: A | Discharge status: Home / Self Care | Payer: 59 | Source: Ambulatory Visit | Attending: Orthopedic Surgery | Admitting: Orthopedic Surgery

## 2021-12-02 DIAGNOSIS — M25562 Pain in left knee: Secondary | ICD-10-CM

## 2023-09-28 ENCOUNTER — Encounter (HOSPITAL_BASED_OUTPATIENT_CLINIC_OR_DEPARTMENT_OTHER): Payer: Self-pay

## 2023-09-28 ENCOUNTER — Ambulatory Visit (HOSPITAL_BASED_OUTPATIENT_CLINIC_OR_DEPARTMENT_OTHER): Admit: 2023-09-28 | Admitting: Orthopaedic Surgery

## 2023-09-28 SURGERY — OPEN REDUCTION INTERNAL FIXATION (ORIF) ANKLE FRACTURE
Anesthesia: General | Site: Ankle | Laterality: Right

## 2024-01-04 ENCOUNTER — Other Ambulatory Visit: Payer: Self-pay | Admitting: Physician Assistant

## 2024-01-04 DIAGNOSIS — Z1231 Encounter for screening mammogram for malignant neoplasm of breast: Secondary | ICD-10-CM

## 2024-01-06 ENCOUNTER — Ambulatory Visit
Admission: RE | Admit: 2024-01-06 | Discharge: 2024-01-06 | Disposition: A | Discharge status: Home / Self Care | Source: Ambulatory Visit | Attending: Physician Assistant | Admitting: Physician Assistant

## 2024-01-06 DIAGNOSIS — Z1231 Encounter for screening mammogram for malignant neoplasm of breast: Secondary | ICD-10-CM
